# Patient Record
Sex: Female | Born: 1960 | Race: White | Marital: Married | Smoking: Current every day smoker
Health system: Southern US, Community
[De-identification: ages and names within clinical notes are randomized; demographics above are authoritative.]

## PROBLEM LIST (undated history)

## (undated) DIAGNOSIS — T7840XA Allergy, unspecified, initial encounter: Secondary | ICD-10-CM

## (undated) HISTORY — PX: CHOLECYSTECTOMY: SHX55

## (undated) HISTORY — DX: Allergy, unspecified, initial encounter: T78.40XA

---

## 2004-12-24 ENCOUNTER — Ambulatory Visit: Payer: Self-pay | Admitting: Gastroenterology

## 2004-12-31 ENCOUNTER — Ambulatory Visit: Payer: Self-pay | Admitting: Gastroenterology

## 2005-01-15 ENCOUNTER — Ambulatory Visit: Payer: Self-pay | Admitting: Surgery

## 2006-10-07 IMAGING — NM NUCLEAR MEDICINE HEPATOHBILIARY INCLUDE GB
2 series · 12 of 12 positions shown · non-contrast
Comparison: none

REASON FOR EXAM: epigastric pain
COMMENTS:

[Series 1: gallbladder dynamic (results) · 4.80mm/px · 6 of 60 frames shown]
[frame 6/60]
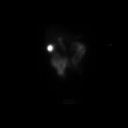
[frame 16/60]
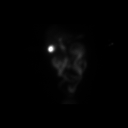
[frame 26/60]
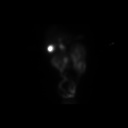
[frame 36/60]
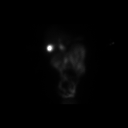
[frame 46/60]
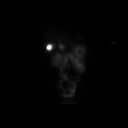
[frame 56/60]
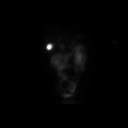

[Series 1: gallbladder dynamic · 4.80mm/px · 6 of 60 frames shown]
[frame 6/60]
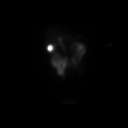
[frame 16/60]
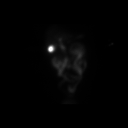
[frame 26/60]
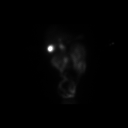
[frame 36/60]
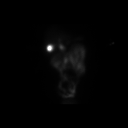
[frame 46/60]
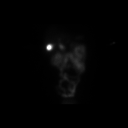
[frame 56/60]
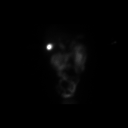

[12 of 12 positions shown; findings below may reference images not displayed]

PROCEDURE:     NM  - NM HEPATO WITH GB EJECT FRACTION  - December 24, 2004  [DATE]

RESULT:     Following injection of 8.02 mCi of technetium-88m Choletec,
there is prompt visualization of tracer activity in the liver at 3 minutes.
At 60 minutes tracer activity is visualized in the gallbladder, common duct
and proximal small bowel.

The gallbladder ejection fraction at 30 minutes measures 23%, which is below
the normal range.
IMPRESSION: 1)Normal hepatobiliary scan.

2)The gallbladder ejection fraction measures 23%, which is in the
hypokinetic range.

## 2015-08-11 ENCOUNTER — Encounter: Payer: Self-pay | Admitting: Physician Assistant

## 2015-08-11 ENCOUNTER — Ambulatory Visit: Payer: Self-pay | Admitting: Physician Assistant

## 2015-08-11 VITALS — BP 130/80 | HR 84 | Temp 98.5°F

## 2015-08-11 DIAGNOSIS — H1033 Unspecified acute conjunctivitis, bilateral: Secondary | ICD-10-CM

## 2015-08-11 MED ORDER — TOBRAMYCIN 0.3 % OP SOLN: 2.0000 [drp] | OPHTHALMIC | Status: DC

## 2015-08-11 NOTE — Progress Notes (Signed)
S:  C/o both eyes being irritated, matted, sx started lastfriday,  denies, cough, congestion, fever, chills, v/d; remainder ros neg, used otc pink eye drops which ?made it worse  O: vitals wnl, nad, perrl eomi, both eyes with injected conjunctiva, + drainage  noted at this time, tms clear, nasal mucosa inflamed, throat wnl, neck supple no lymph, lungs c t a, cv rrr  A:  Acute conjunctivitis  P: tobramycin opth gtts, return if not better in 3 - 5d, if worsening return earlier or see eye doctor, no work until eye is no longer red or draining, if not improving with tobramycin will add steroid drop

## 2015-08-12 ENCOUNTER — Telehealth: Payer: Self-pay | Admitting: Emergency Medicine

## 2015-08-12 MED ORDER — PREDNISOLONE ACETATE 1 % OP SUSP: 1.0000 [drp] | Freq: Four times a day (QID) | OPHTHALMIC | Status: DC

## 2015-08-12 NOTE — Telephone Encounter (Signed)
Patient expressed that her eye is getting worse and wants to know if you can send in a script for eye drops. She uses [facility] [place].

## 2015-08-12 NOTE — Telephone Encounter (Signed)
Prednisone opth drops approved, pt states redness getting worse

## 2015-11-04 ENCOUNTER — Encounter: Payer: Self-pay | Admitting: Physician Assistant

## 2015-11-04 ENCOUNTER — Ambulatory Visit: Payer: Self-pay | Admitting: Physician Assistant

## 2015-11-04 VITALS — BP 163/92 | HR 83 | Temp 98.2°F

## 2015-11-04 DIAGNOSIS — H1033 Unspecified acute conjunctivitis, bilateral: Secondary | ICD-10-CM

## 2015-11-04 MED ORDER — TOBRAMYCIN-DEXAMETHASONE 0.3-0.1 % OP SUSP: 2.0000 [drp] | OPHTHALMIC | 0 refills | Status: DC

## 2015-11-04 NOTE — Progress Notes (Signed)
S: c/o both eyes being red and itchy, states started a few days ago, similar sx in June, uses otc drops for dry eyes, eyes just run all of the time, no fever/chills, no change in vision, uses maybelline mascara  O: vitals wnl, nad, conjunctiva injected b/l with clear drainage b/l, neck supple no lymph, lungs c t a, cv rrr  A: acute conjunctivitis  P: tobradex, f/u with eye doctor next week, discard mascara and use a hypoallergenic mascara

## 2016-07-28 ENCOUNTER — Encounter: Payer: Self-pay | Admitting: Nurse Practitioner

## 2016-07-28 ENCOUNTER — Ambulatory Visit (INDEPENDENT_AMBULATORY_CARE_PROVIDER_SITE_OTHER): Payer: Managed Care, Other (non HMO) | Admitting: Nurse Practitioner

## 2016-07-28 VITALS — BP 124/72 | HR 89 | Temp 98.1°F | Ht 66.0 in | Wt 157.0 lb

## 2016-07-28 DIAGNOSIS — Z7689 Persons encountering health services in other specified circumstances: Secondary | ICD-10-CM

## 2016-07-28 DIAGNOSIS — J309 Allergic rhinitis, unspecified: Secondary | ICD-10-CM | POA: Diagnosis not present

## 2016-07-28 NOTE — Progress Notes (Addendum)
Subjective:    Patient ID: Mercedes Klein, female    DOB: 1960/06/20, 56 y.o.   MRN: [id number]  Mercedes Klein is a 56 y.o. female presenting on 07/28/2016 for New Patient (Initial Visit) (Establish care )   HPI Establish Care New Provider Pt last seen by PCP about 6 years ago.  Obtain records from Dr. Christell Faith old system.   Has had gall bladder removed.  Avoids fried and msg r/t diarrhea.  Diagnostic workup for cholecystitis involved lots of workup causing significant anxiety.  Workup included cardiac, mammogram.  Seasonal allergies Has itchy eyes, sinus pressure, congestion. She has taken antihistamine for about 6 months.  Was taking Xyzal for several months, but has recently changed to zyrtec -D for congestion and has added flonase in the last week.  Ongoing difficulty most with itchy, watery eyes.  She has had minimal relief with Visine allergy eye drops (active ingredient antihistamine pheniramine maleate).  Current tobacco use 1/2 ppd x 40 years. Not ready to quit. Has quit in past "cold-turkey" and wants to quit again in future.  Health Maintenance Optometry: never, screening for acuity at work wellness fair "good" Dentist: not recent, but has been regular w/ dentist in past. Exercises daily: significant walking at work - Does get increase in HR and RR  Past Medical History:  Diagnosis Date  . Allergy       Past Surgical History:  Procedure Laterality Date  . CHOLECYSTECTOMY        Social History   Social History  . Marital status: Married    Spouse name: N/A  . Number of children: N/A  . Years of education: N/A   Occupational History  . Not on file.   Social History Main Topics  . Smoking status: Current Every Day Smoker    Packs/day: 0.50    Years: 20.00    Start date: 07/28/1996  . Smokeless tobacco: Never Used     Comment: used to smoke 1 ppd.  0.5 ppd for several years.  . Alcohol use Yes     Comment: 8 week  . Drug use: No  . Sexual activity:  Not Currently    Birth control/ protection: Post-menopausal   Other Topics Concern  . 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 COPD Mother   . Mental illness Mother   . Healthy Father   . Hypertension Father   . Healthy Sister   . Hypertension Brother   . Colon cancer Maternal Grandmother 90  . Diabetes Paternal Grandmother   . Heart disease Paternal Grandmother   . Healthy Sister   . Ovarian cancer Neg Hx   . Breast cancer Neg Hx   . Stroke Neg Hx    No current outpatient prescriptions on file prior to visit.   No current facility-administered medications on file prior to visit.     Review of Systems  Constitutional: Negative.   HENT: Positive for sinus pressure.   Eyes: Positive for itching.  Respiratory: Negative.   Cardiovascular: Negative.   Gastrointestinal: Negative.   Endocrine: Negative.   Genitourinary: Negative.   Musculoskeletal: Negative.   Skin: Negative.   Allergic/Immunologic: Negative.   Neurological: Negative.   Hematological: Negative.   Psychiatric/Behavioral: Positive for sleep disturbance.       Has trouble falling asleep, wakes early. Uses alcohol nightly to assist w/  sleep    Per HPI unless specifically indicated above     Objective:    BP 124/72 (BP Location: Right Arm, Patient Position: Sitting, Cuff Size: Normal)   Pulse 89   Temp 98.1 F (36.7 C) (Oral)   Ht 5\' 6"  (1.676 m)   Wt 157 lb (71.2 kg)   BMI 25.34 kg/m    Wt Readings from Last 3 Encounters:  07/28/16 157 lb (71.2 kg)    Physical Exam  Constitutional: She is oriented to person, place, and time. She appears well-developed and well-nourished.  HENT:  Head: Normocephalic and atraumatic.  Right Ear: Tympanic membrane, external ear and ear canal normal.  Left Ear: Tympanic membrane, external ear and ear canal normal.  Nose: Nose  normal. Right sinus exhibits no maxillary sinus tenderness and no frontal sinus tenderness. Left sinus exhibits no maxillary sinus tenderness and no frontal sinus tenderness.  Mouth/Throat: Uvula is midline, oropharynx is clear and moist and mucous membranes are normal.  Eyes: Conjunctivae are normal. Pupils are equal, round, and reactive to light. Right eye exhibits no discharge. Left eye exhibits no discharge.  Cardiovascular: Normal rate, regular rhythm and normal heart sounds.   Pulmonary/Chest: Effort normal and breath sounds normal. No respiratory distress.  Musculoskeletal: Normal range of motion.  Neurological: She is alert and oriented to person, place, and time.  Skin: Skin is warm and dry.  Psychiatric: She has a normal mood and affect. Her behavior is normal. Judgment and thought content normal.  Vitals reviewed.   No results found for this or any previous visit.    Assessment & Plan:   Problem List Items Addressed This Visit      Respiratory   Allergic rhinitis - Primary    Stable except for eye itching and discharge symptoms on OTC Zyrtec-D and flonase.  Plan: 1. Continue Zyrtec and Zyrtec-D only if congestion. 2. Continue flonase for 4-6 weeks as needed. Stop use if epistaxis. 3. START zaditor or similar opthalmic antihistamine with ketotifen fumarate active ingredient OTC. 4. Follow up as needed.       Other Visit Diagnoses    Tobacco Use Pt not ready to quit.  Discussed options for quitting when ready.  Discussed complications of smoking and importance for lung and cardiovascular health.  Discussion today >5 minutes (<10 minutes) specifically on counseling on risks of tobacco use, complications, treatment, smoking cessation.   Encounter to establish care       Pt previously seen >6 years [facility] at [facility].  Needs annual physical and screenings. Info provided about colon cancer screening for decision-making prior to CPE visit.      Meds ordered  this encounter  Medications  . Tetrahydrozoline-Zn Sulfate (VISINE-AC OP)    Sig: Apply to eye.  . cetirizine-pseudoephedrine (ZYRTEC-D) 5-120 MG tablet    Sig: Take 1 tablet by mouth once.      Follow up plan: Return in about 1 week (around 08/04/2016) for annual physical.  Wilhelmina McardleLauren Jaidon Sponsel, DNP, AGPCNP-BC Adult Gerontology Primary Care Nurse Practitioner [facility] 07/28/2016, 12:26 PM

## 2016-07-28 NOTE — Assessment & Plan Note (Signed)
Stable except for eye itching and discharge symptoms on OTC Zyrtec-D and flonase.  Plan: 1. Continue Zyrtec and Zyrtec-D only if congestion. 2. Continue flonase for 4-6 weeks as needed. Stop use if epistaxis. 3. START zaditor or similar opthalmic antihistamine with ketotifen fumarate active ingredient OTC. 4. Follow up as needed.

## 2016-07-28 NOTE — Patient Instructions (Addendum)
Ellamay, Thank you for coming in to clinic today.  1. For your allergies: - Zyrtec or Zyrtec-D for allergies.  Avoid D if you are not congested. - Flonase 2 sprays each nostril once daily 4-6 weeks then take a break.  If you have nose bleeds, stop taking. - Zaditor (orange box) or other eye drop with active ingredient ketotifen fumarate (eye antihistamine)  2. For your annual physical - Schedule another appointment at your next convenience. Bring your health screening labs to your appointment.  You will be due for FASTING BLOOD WORK (no food or drink after midnight before, only water or coffee without cream/sugar on the morning of) - Please go ahead and schedule a "Lab Only" visit in the morning at the clinic for lab draw in 2 days before next Annual Physical or on the same day. - For Lab Results, once available within 2-3 days of blood draw, you can can log in to MyChart online to view your results and a brief explanation. Also, we can discuss results at next follow-up visit.  3. Colon Cancer Screening: - For all adults age 54 and older, routine colon cancer screening is highly recommended. - Early detection of colon cancer is important, because often there are no warning signs or symptoms.  If colon cancer is found early, usually it can be cured. Advanced cancer is hard to treat.  - If you are not interested in Colonoscopy screening (if done and normal you could be cleared for 5 to 10 years until next due), then Cologuard is an excellent alternative for screening test for Colon Cancer. It is highly sensitive for detecting DNA of colon cancer from even the earliest stages. Also, there is NO bowel prep required. - If Cologuard is NEGATIVE, then it is good for 3 years before next due - If Cologuard is POSITIVE, then it is strongly advised to get a Colonoscopy, which allows the GI doctor to locate the source of the cancer or polyp (even very early stage) and treat it by removing  it. ------------------------- If you would like to proceed with Cologuard (stool DNA test) - FIRST, call your insurance company and tell them you want to check cost of Cologuard tell them CPT Code [phone] (it may be completely covered with a small or no cost, OR max cost without any coverage is about $600). If you do NOT open the kit, and decide not to do the test, you will NOT be charged, you should contact the company to return the kit if you decide not to do the test. - If you want to proceed, you can notify us (office phone, [place], or at next visit) and we will order it for you. The test kit will be delivered to your house in about 1 week. Follow instructions to collect your stool sample.  You may call the company for any help or questions, 24/7 telephone support at [phone].   Please schedule a follow-up appointment with Cassell Smiles, AGNP to Return in about 1 week (around 08/04/2016) for annual physical.  If you have any other questions or concerns, please feel free to call the clinic or send a message through [place]. You may also schedule an earlier appointment if necessary.  Cassell Smiles, DNP, AGNP-BC Adult Gerontology Nurse Practitioner [place]

## 2016-07-28 NOTE — Progress Notes (Signed)
I have reviewed this encounter including the documentation in this note and/or discussed this patient with the provider, Wilhelmina McardleLauren Kennedy, AGPCNP-BC. I am certifying that I agree with the content of this note as supervising physician.  Saralyn PilarAlexander Karamalegos, DO [facility] 07/28/2016, 5:23 PM

## 2016-08-04 ENCOUNTER — Other Ambulatory Visit: Payer: Self-pay | Admitting: Nurse Practitioner

## 2016-08-04 ENCOUNTER — Ambulatory Visit (INDEPENDENT_AMBULATORY_CARE_PROVIDER_SITE_OTHER): Payer: Managed Care, Other (non HMO) | Admitting: Nurse Practitioner

## 2016-08-04 ENCOUNTER — Encounter: Payer: Self-pay | Admitting: Nurse Practitioner

## 2016-08-04 VITALS — BP 133/83 | HR 103 | Temp 98.5°F | Ht 66.0 in | Wt 158.8 lb

## 2016-08-04 DIAGNOSIS — Z114 Encounter for screening for human immunodeficiency virus [HIV]: Secondary | ICD-10-CM | POA: Diagnosis not present

## 2016-08-04 DIAGNOSIS — Z1159 Encounter for screening for other viral diseases: Secondary | ICD-10-CM | POA: Diagnosis not present

## 2016-08-04 DIAGNOSIS — Z0000 Encounter for general adult medical examination without abnormal findings: Secondary | ICD-10-CM | POA: Diagnosis not present

## 2016-08-04 DIAGNOSIS — Z1239 Encounter for other screening for malignant neoplasm of breast: Secondary | ICD-10-CM

## 2016-08-04 DIAGNOSIS — Z1211 Encounter for screening for malignant neoplasm of colon: Secondary | ICD-10-CM | POA: Diagnosis not present

## 2016-08-04 DIAGNOSIS — Z13820 Encounter for screening for osteoporosis: Secondary | ICD-10-CM

## 2016-08-04 DIAGNOSIS — Z124 Encounter for screening for malignant neoplasm of cervix: Secondary | ICD-10-CM

## 2016-08-04 DIAGNOSIS — Z1231 Encounter for screening mammogram for malignant neoplasm of breast: Secondary | ICD-10-CM

## 2016-08-04 LAB — LIPID PANEL
Cholesterol: 222 mg/dL — ABNORMAL HIGH (ref <200)
HDL: 107 mg/dL (ref >50)
LDL Cholesterol: 104 mg/dL — ABNORMAL HIGH (ref <100)
Total CHOL/HDL Ratio: 2.1 Ratio (ref <5.0)
Triglycerides: 53 mg/dL (ref <150)
VLDL: 11 mg/dL (ref <30)

## 2016-08-04 LAB — TSH: TSH: 1.37 mIU/L

## 2016-08-04 NOTE — Patient Instructions (Addendum)
Mercedes Klein, Thank you for coming in to clinic today.  1. For your annual exam:  - After your labs result today, I will release them to your MyChart account in about 2 days after receiving your results. - Keep working on your smoking cessation. Great work with cutting back! - If you stop smoking and lose about 5 lbs, you will significantly reduce your risk of heart attack and stroke.  2. For your mammogram: - Call to schedule your own appointment at [phone]  3. For your colonoscopy: - They will call you to schedule your appointment.  4. For your bone density scan: - They will call you to schedule this.  You can probably do this the same day as your mammogram.  Please schedule a follow-up appointment with [name], AGNP to Return in about 1 year (around 08/04/2017) for annual physical.  If you have any other questions or concerns, please feel free to call the clinic or send a message through MyChart. You may also schedule an earlier appointment if necessary.  Wilhelmina McardleLauren Dalbert Stillings, DNP, AGNP-BC Adult Gerontology Nurse Practitioner [facility]    Bone Densitometry Bone densitometry is an imaging test that uses a special X-ray to measure the amount of calcium and other minerals in your bones (bone density). This test is also known as a bone mineral density test or dual-energy X-ray absorptiometry (DXA). The test can measure bone density at your hip and your spine. It is similar to having a regular X-ray. You may have this test to:  Diagnose a condition that causes weak or thin bones (osteoporosis).  Predict your risk of a broken bone (fracture).  Determine how well osteoporosis treatment is working.  Tell a health care provider about:  Any allergies you have.  All medicines you are taking, including vitamins, herbs, eye drops, creams, and over-the-counter medicines.  Any problems you or family members have had with anesthetic medicines.  Any blood  disorders you have.  Any surgeries you have had.  Any medical conditions you have.  Possibility of pregnancy.  Any other medical test you had within the previous 14 days that used contrast material. What are the risks? Generally, this is a safe procedure. However, problems can occur and may include the following:  This test exposes you to a very small amount of radiation.  The risks of radiation exposure may be greater to unborn children.  What happens before the procedure?  Do not take any calcium supplements for 24 hours before having the test. You can otherwise eat and drink what you usually do.  Take off all metal jewelry, eyeglasses, dental appliances, and any other metal objects. What happens during the procedure?  You may lie on an exam table. There will be an X-ray generator below you and an imaging device above you.  Other devices, such as boxes or braces, may be used to position your body properly for the scan.  You will need to lie still while the machine slowly scans your body.  The images will show up on a computer monitor. What happens after the procedure? You may need more testing at a later time. This information is not intended to replace advice given to you by your health care provider. Make sure you discuss any questions you have with your health care provider. Document Released: 02/24/2004 Document Revised: 07/10/2015 Document Reviewed: 07/12/2013 Elsevier Interactive Patient Education  2018 [employer]

## 2016-08-04 NOTE — Progress Notes (Signed)
Subjective:    Patient ID: Mercedes Klein, female    DOB: 09/29/1960, 56 y.o.   MRN: [id number]  Mercedes Klein is a 56 y.o. female presenting on 08/04/2016 for Annual Exam   HPI  Annual Physical Patient has been feeling well.  They have no acute concerns today. Sleeps 5-7 hours per night uninterrupted, but wakes early before her alarm.  Health Maintenance Weight/BMI: 25.4 Physical activity: walk frequently w/ getting HR elevated Diet: eats 1-2 meals out each week, occasional pizza /fried foods lots of vegetables, fruit, avoids sweets and extra sugar Seatbelt: always Sunscreen: no PAP: Last 6-7 years ago Mammogram: Last was 6-7 years ago DEXA: never received  Colonoscopy: Never done: pt prefers colonoscopy over cologard Tetanus: 4-5 years ago    Past Medical History:  Diagnosis Date  . Allergy    Past Surgical History:  Procedure Laterality Date  . CHOLECYSTECTOMY     Social History   Social History  . Marital status: Married    Spouse name: N/A  . Number of children: N/A  . Years of education: N/A   Occupational History  . Not on file.   Social History Main Topics  . Smoking status: Current Every Day Smoker    Packs/day: 0.50    Years: 20.00    Start date: 07/28/1996  . Smokeless tobacco: Never Used     Comment: used to smoke 1 ppd.  0.5 ppd for several years.  . Alcohol use Yes     Comment: 8 week  . Drug use: No  . Sexual activity: Not Currently    Birth control/ protection: Post-menopausal   Other Topics Concern  . 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 COPD Mother   . Mental illness Mother   . Healthy Father   . Hypertension Father   . Healthy Sister   . Hypertension Brother   . Colon cancer Maternal Grandmother 90  . Diabetes Paternal Grandmother   . Heart disease Paternal Grandmother   . Healthy  Sister   . Ovarian cancer Neg Hx   . Breast cancer Neg Hx   . Stroke Neg Hx    Current Outpatient Prescriptions on File Prior to Visit  Medication Sig  . Tetrahydrozoline-Zn Sulfate (VISINE-AC OP) Apply to eye.  . cetirizine-pseudoephedrine (ZYRTEC-D) 5-120 MG tablet Take 1 tablet by mouth once.   No current facility-administered medications on file prior to visit.     Review of Systems  All other systems reviewed and are negative.  Per HPI unless specifically indicated above      Objective:    BP 133/83 (BP Location: Right Arm, Patient Position: Sitting, Cuff Size: Normal)   Pulse (!) 103   Temp 98.5 F (36.9 C) (Oral)   Ht 5\' 6"  (1.676 m)   Wt 158 lb 12.8 oz (72 kg)   SpO2 100%   BMI 25.63 kg/m    Wt Readings from Last 3 Encounters:  08/04/16 158 lb 12.8 oz (72 kg)  07/28/16 157 lb (71.2 kg)    Physical Exam  General - healthy, well-appearing, NAD HEENT - Normocephalic, atraumatic, PERRL, EOMI, patent nares w/o congestion, oropharynx clear, MMM Neck - supple, non-tender, no LAD, no thyromegaly Heart - RRR, bradycardia, no murmurs heard Lungs - Clear throughout all lobes, no wheezing, crackles, or rhonchi. Normal work of breathing. Abdomen - soft,  NTND, no masses, no hepatosplenomegaly, active bowel sounds GU - Normal external female genitalia without lesions or fusion. Vaginal canal without lesions.  Minimal vaginal atrophy noted. Normal appearing cervix without lesions or friability. Physiologic discharge on exam. Bimanual exam without adnexal masses, enlarged uterus, or cervical motion tenderness Extremeties - non-tender, no edema, cap refill < 2 seconds, peripheral pulses intact +2 bilaterally Skin - warm, dry, no rashes Neuro - awake, alert, oriented, CN II-X intact, intact muscle strength 5/5 bilaterally, intact distal sensation to light touch, normal coordination, normal gait Psych - Normal mood and affect, normal behavior   Results for orders placed or  performed in visit on 08/04/16  Lipid panel  Result Value Ref Range   Cholesterol 222 (H) <200 mg/dL   Triglycerides 53 <865<150 mg/dL   HDL 784107 >69>50 mg/dL   Total CHOL/HDL Ratio 2.1 <5.0 Ratio   VLDL 11 <30 mg/dL   LDL Cholesterol 629104 (H) <100 mg/dL  Hemoglobin B2WA1c  Result Value Ref Range   Hgb A1c MFr Bld 5.2 <5.7 %   Mean Plasma Glucose 103 mg/dL  TSH  Result Value Ref Range   TSH 1.37 mIU/L  Vitamin D (25 hydroxy)  Result Value Ref Range   Vit D, 25-Hydroxy 17 (L) 30 - 100 ng/mL      Assessment & Plan:   Problem List Items Addressed This Visit    None    Visit Diagnoses    Colon cancer screening    -  Primary   Pt never received screening colonoscopy in past.  Due today.   Plan: 1. Referral to GI   Relevant Orders   Ambulatory referral to Gastroenterology   Encounter for annual physical exam     Physical exam with no new findings.  Well adult with no acute concerns.  Plan: 1. Obtain health maintenance screenings. 2. Return 1 year for annual physical.   Relevant Orders   Lipid panel (Completed)   Hemoglobin A1c (Completed)   TSH (Completed)   Vitamin D (25 hydroxy) (Completed)   Encounter for hepatitis C screening test for low risk patient       Age-related recommendation in low risk pt.  Pt agrees to proceed.  Plan: 1. Hep C antibody lab.   Screening for HIV without presence of risk factors       age-related recommendation in low risk pt.  Pt agrees to proceed.  Plan: 1. HIV antibody lab.   Breast cancer screening       Pt w/o prior mammogram.  Pt agrees to proceed w/ breast cancer screening.  Plan: 1. Bilat screening mammogram   Relevant Orders   Mammogram Digital Screening   Osteoporosis screening       Pt is postmenopausal w/o prior DEXA scan.  Pt agrees to proceed.  Plan: 1. DEXA scan screen osteoporosis.   Relevant Orders   DG Bone Density   Encounter for Papanicolaou smear for cervical cancer screening       Pt w/o recent pap.  Prior pap were  normal.  Pt agrees to proceed.  Vaginal exam normal today.  Plan: 1. PAP today w/ HPV.   Relevant Orders   PAP, Thin Prep w/HPV rflx HPV Type 16/18      Meds ordered this encounter  Medications  . guaiFENesin (MUCINEX) 600 MG 12 hr tablet    Sig: Take by mouth 2 (two) times daily.  . fluticasone (FLONASE) 50 MCG/ACT nasal spray    Sig: Place into both nostrils daily.  .Marland Kitchen  ketotifen ([employer] ANTIHISTAMINE EYE DROPS) 0.025 % ophthalmic solution    Sig: 1 drop 2 (two) times daily.      Follow up plan: Return in about 1 year (around 08/04/2017) for annual physical.  Wilhelmina Mcardle, DNP, AGPCNP-BC Adult Gerontology Primary Care Nurse Practitioner [facility] 08/09/2016, 11:12 AM

## 2016-08-05 LAB — HEMOGLOBIN A1C
Hgb A1c MFr Bld: 5.2 % (ref <5.7)
Mean Plasma Glucose: 103 mg/dL

## 2016-08-05 LAB — VITAMIN D 25 HYDROXY (VIT D DEFICIENCY, FRACTURES): Vit D, 25-Hydroxy: 17 ng/mL — ABNORMAL LOW (ref 30–100)

## 2016-08-06 LAB — PAP, THIN PREP W/HPV RFLX HPV TYPE 16/18: HPV DNA High Risk: NOT DETECTED

## 2016-08-09 NOTE — Progress Notes (Signed)
I have reviewed this encounter including the documentation in this note and/or discussed this patient with the provider, Wilhelmina McardleLauren Kennedy, AGPCNP-BC. I am certifying that I agree with the content of this note as supervising physician.  Saralyn PilarAlexander Lynzi Meulemans, DO [facility] 08/09/2016, 1:43 PM

## 2017-05-04 ENCOUNTER — Encounter: Payer: Self-pay | Admitting: Family Medicine

## 2017-05-04 ENCOUNTER — Ambulatory Visit: Payer: Self-pay | Admitting: Family Medicine

## 2017-05-04 VITALS — BP 142/92 | HR 82 | Temp 98.3°F | Resp 20 | Ht 64.0 in | Wt 164.0 lb

## 2017-05-04 DIAGNOSIS — Z008 Encounter for other general examination: Secondary | ICD-10-CM

## 2017-05-04 DIAGNOSIS — Z0189 Encounter for other specified special examinations: Principal | ICD-10-CM

## 2017-05-04 LAB — GLUCOSE, POCT (MANUAL RESULT ENTRY): POC GLUCOSE: 101 mg/dL — AB (ref 70–99)

## 2017-05-04 NOTE — Progress Notes (Signed)
Subjective: Annual biometrics screening  Patient presents for their annual biometric screening. Patient has a history of allergic rhinitis.  Patient reports that she usually eats a very healthy diet but that sinceThanksgiving she has been eating more sweets and carbs because they have been available in her office. Patient denies any symptoms or complaints currently. 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 Patient denies any other issues or concerns.   Review of Systems Constitutional: Unremarkable.  HEENT: Denies dizziness, issues with hearing, vision problems.  Gastrointestinal: Denies issues with bowel or bladder.  Respiratory: Unremarkable.   Cardiovascular: Unremarkable.  ROS otherwise negative.   Objective  Physical Exam General: Awake, alert and oriented. No acute distress. Well developed, hydrated and nourished. Appears stated age.  HEENT:  Supple neck without adenopathy. Sclera is non-icteric. The ear canal is clear without discharge. The tympanic membrane is normal in appearance with normal landmarks and cone of light. Nasal mucosa is pink and moist. Oral mucosa is pink and moist. The pharynx is normal in appearance without tonsillar swelling or exudates.  Skin: Skin in warm, dry and intact without rashes or lesions. Appropriate color for ethnicity. Cardiac: Heart rate and rhythm are normal. No murmurs, gallops, or rubs are auscultated.  Respiratory: The chest wall is symmetric and without deformity. No signs of respiratory distress. Lung sounds are clear in all lobes bilaterally without rales, ronchi, or wheezes.  Abdominal: Abdomen is soft, symmetric, and non-tender without distention. No masses, hepatomegaly, or splenomegaly are noted.  Neurological: The patient is awake, alert and oriented to person, place, and time with normal speech.  Memory is normal and thought processes intact. No gait abnormalities are appreciated.  Psychiatric: Appropriate mood  and affect.   Assessment Annual biometrics screen  Plan  Labs pending. Encouraged routine visits with primary care provider.  Fasting blood sugar 101 today.  Patient reports is true fasting value.  Patient educated regarding implications of prediabetes and diabetes.  Patient instructed to have this reevaluated at her upcoming primary care provider annual visit.  Discussed resuming healthy diet.  Blood pressure slightly elevated at 142/92.  Patient reports this is related to anxiety when she comes the doctor's office.  Advised patient monitor this at home, what normal/abnormal values are, and to report this to her primary care provider.

## 2017-05-05 LAB — LIPID PANEL
CHOL/HDL RATIO: 2.1 ratio (ref 0.0–4.4)
Cholesterol, Total: 248 mg/dL — ABNORMAL HIGH (ref 100–199)
HDL: 116 mg/dL (ref >39)
LDL Calculated: 122 mg/dL — ABNORMAL HIGH (ref 0–99)
Triglycerides: 51 mg/dL (ref 0–149)
VLDL Cholesterol Cal: 10 mg/dL (ref 5–40)

## 2017-05-05 NOTE — Progress Notes (Signed)
Mr. Ernestine ConradOmalley, I wanted to let you know that your blood work came back and that your total cholesterol and LDL cholesterol were elevated.  Your total cholesterol was 248, normal values are between 100 and 199.  Your LDL cholesterol ("bad cholesterol") was elevated at 122, normal values are between 0 and 99.  I wanted to make you aware of these results so that you could discuss them with your primary care provider at your next scheduled visit.

## 2018-02-15 DEATH — deceased
